# Patient Record
Sex: Male | Born: 2000 | Hispanic: Yes | Marital: Single | Smoking: Never smoker
Health system: Southern US, Community
[De-identification: ages and names within clinical notes are randomized; demographics above are authoritative.]

## PROBLEM LIST (undated history)

## (undated) DIAGNOSIS — J45909 Unspecified asthma, uncomplicated: Secondary | ICD-10-CM

---
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
## 2019-07-24 DIAGNOSIS — Z419 Encounter for procedure for purposes other than remedying health state, unspecified: Secondary | ICD-10-CM | POA: Diagnosis not present

## 2019-08-24 DIAGNOSIS — Z419 Encounter for procedure for purposes other than remedying health state, unspecified: Secondary | ICD-10-CM | POA: Diagnosis not present

## 2019-09-24 DIAGNOSIS — Z419 Encounter for procedure for purposes other than remedying health state, unspecified: Secondary | ICD-10-CM | POA: Diagnosis not present

## 2019-10-24 DIAGNOSIS — Z419 Encounter for procedure for purposes other than remedying health state, unspecified: Secondary | ICD-10-CM | POA: Diagnosis not present

## 2019-11-24 DIAGNOSIS — Z419 Encounter for procedure for purposes other than remedying health state, unspecified: Secondary | ICD-10-CM | POA: Diagnosis not present

## 2019-11-25 ENCOUNTER — Other Ambulatory Visit: Payer: Self-pay

## 2019-11-25 ENCOUNTER — Ambulatory Visit (INDEPENDENT_AMBULATORY_CARE_PROVIDER_SITE_OTHER): Payer: Self-pay

## 2019-11-25 ENCOUNTER — Encounter: Payer: Self-pay | Admitting: Emergency Medicine

## 2019-11-25 ENCOUNTER — Ambulatory Visit
Admission: EM | Admit: 2019-11-25 | Discharge: 2019-11-25 | Disposition: A | Discharge status: Home / Self Care | Payer: Self-pay | Attending: Emergency Medicine | Admitting: Emergency Medicine

## 2019-11-25 DIAGNOSIS — M25551 Pain in right hip: Secondary | ICD-10-CM

## 2019-11-25 LAB — CBC WITH DIFFERENTIAL/PLATELET
Abs Immature Granulocytes: 0.02 10*3/uL (ref 0.00–0.07)
Basophils Absolute: 0 10*3/uL (ref 0.0–0.1)
Basophils Relative: 1 %
Eosinophils Absolute: 0.1 10*3/uL (ref 0.0–0.5)
Eosinophils Relative: 2 %
HCT: 44.5 % (ref 39.0–52.0)
Hemoglobin: 15.1 g/dL (ref 13.0–17.0)
Immature Granulocytes: 0 %
Lymphocytes Relative: 27 %
Lymphs Abs: 1.9 10*3/uL (ref 0.7–4.0)
MCH: 29.2 pg (ref 26.0–34.0)
MCHC: 33.9 g/dL (ref 30.0–36.0)
MCV: 86.1 fL (ref 80.0–100.0)
Monocytes Absolute: 0.6 10*3/uL (ref 0.1–1.0)
Monocytes Relative: 9 %
Neutro Abs: 4.2 10*3/uL (ref 1.7–7.7)
Neutrophils Relative %: 61 %
Platelets: 267 10*3/uL (ref 150–400)
RBC: 5.17 MIL/uL (ref 4.22–5.81)
RDW: 12.6 % (ref 11.5–15.5)
WBC: 6.9 10*3/uL (ref 4.0–10.5)
nRBC: 0 % (ref 0.0–0.2)

## 2019-11-25 MED ORDER — IBUPROFEN 600 MG PO TABS: 600.0000 mg | ORAL_TABLET | Freq: Four times a day (QID) | ORAL | 0 refills | Status: DC | PRN

## 2019-11-25 NOTE — ED Triage Notes (Signed)
Patient c/o right hip pain that started 1 year ago. He states over the last week the pain has increased.

## 2019-11-25 NOTE — ED Provider Notes (Signed)
[facility]    CSN: [id number] Arrival date & time: 11/25/19  1218      History   Chief Complaint Chief Complaint  Patient presents with  . Hip Pain    HPI Timothy Garza is a 19 y.o. male.   19 year old male here for evaluation of right hip pain.  Patient reports that she has had pain in the hip for a year but has become worse in the past week.  He states that he has never been evaluated for this.  He denies injury.  He describes the pain as throbbing and radiating to the level of the knee.  Patient denies back pain, bruising, fever, numbness, sweats, chills, or weight loss.     History reviewed. No pertinent past medical history.  There are no problems to display for this patient.   History reviewed. No pertinent surgical history.     Home Medications    Prior to Admission medications   Medication Sig Start Date End Date Taking? Authorizing Provider  ibuprofen (ADVIL) 600 MG tablet Take 1 tablet (600 mg total) by mouth every 6 (six) hours as needed. 11/25/19   Becky Augusta, NP    Family History Family History  Problem Relation Age of Onset  . Healthy Mother   . Healthy Father     Social History Social History   Tobacco Use  . Smoking status: Never Smoker  . Smokeless tobacco: Never Used  Vaping Use  . Vaping Use: Never used  Substance Use Topics  . Alcohol use: Never  . Drug use: Never     Allergies   Patient has no known allergies.   Review of Systems Review of Systems  Constitutional: Negative for activity change, appetite change, diaphoresis, fatigue, fever and unexpected weight change.  Respiratory: Negative for shortness of breath.   Cardiovascular: Negative for chest pain.  Musculoskeletal: Positive for arthralgias and gait problem. Negative for back pain, joint swelling and myalgias.  Skin: Negative for color change and rash.  Neurological: Negative for weakness and numbness.  Hematological: Negative.     Psychiatric/Behavioral: Negative.      Physical Exam Triage Vital Signs ED Triage Vitals  Enc Vitals Group     BP 11/25/19 1231 (!) 138/58     Pulse Rate 11/25/19 1231 63     Resp 11/25/19 1231 18     Temp 11/25/19 1231 98 F (36.7 C)     Temp Source 11/25/19 1231 Oral     SpO2 11/25/19 1231 99 %     Weight --      Height 11/25/19 1229 5\' 9"  (1.753 m)     Head Circumference --      Peak Flow --      Pain Score 11/25/19 1229 6     Pain Loc --      Pain Edu? --      Excl. in GC? --    No data found.  Updated Vital Signs BP (!) 138/58 (BP Location: Right Arm)   Pulse 63   Temp 98 F (36.7 C) (Oral)   Resp 18   Ht 5\' 9"  (1.753 m)   SpO2 99%   Visual Acuity Right Eye Distance:   Left Eye Distance:   Bilateral Distance:    Right Eye Near:   Left Eye Near:    Bilateral Near:     Physical Exam Vitals and nursing note reviewed.  Constitutional:      General: He is not in acute distress.  Appearance: Normal appearance. He is normal weight. He is not toxic-appearing.  HENT:     Head: Normocephalic and atraumatic.  Eyes:     General: No scleral icterus.    Extraocular Movements: Extraocular movements intact.     Conjunctiva/sclera: Conjunctivae normal.     Pupils: Pupils are equal, round, and reactive to light.  Cardiovascular:     Rate and Rhythm: Normal rate and regular rhythm.     Pulses: Normal pulses.     Heart sounds: Normal heart sounds. No murmur heard.  No gallop.   Pulmonary:     Effort: Pulmonary effort is normal.     Breath sounds: Normal breath sounds. No wheezing, rhonchi or rales.  Musculoskeletal:        General: Tenderness present. No swelling, deformity or signs of injury.     Cervical back: Normal range of motion and neck supple.     Right lower leg: No edema.     Comments: Patient has bony tenderness to the iliac crest on palpation.  There is no pain to the greater trochanter.  Patient does have pain with passive range of motion.  From  45 through 90 degrees of flexion patient has pain in the right hip.  The pain is not present with extension, internal rotation or external rotation.  Negative straight leg raise on the right side.  DP and PT pulses are 2+ on the right.  Skin:    General: Skin is warm and dry.     Capillary Refill: Capillary refill takes less than 2 seconds.     Findings: No bruising, erythema, lesion or rash.  Neurological:     General: No focal deficit present.     Mental Status: He is alert and oriented to person, place, and time.  Psychiatric:        Mood and Affect: Mood normal.        Behavior: Behavior normal.        Thought Content: Thought content normal.        Judgment: Judgment normal.      UC Treatments / Results  Labs (all labs ordered are listed, but only abnormal results are displayed) Labs Reviewed  CBC WITH DIFFERENTIAL/PLATELET    EKG   Radiology DG Hip Unilat W or Wo Pelvis 2-3 Views Right  Result Date: 11/25/2019 CLINICAL DATA:  Tenderness to right iliac crest.  No known injury. EXAM: DG HIP (WITH OR WITHOUT PELVIS) 2-3V RIGHT COMPARISON:  No prior. FINDINGS: No acute bony or joint abnormality. No evidence of fracture dislocation. Tiny well sclerotic density noted over the left acetabulum and right femoral head, most likely bone islands. IMPRESSION: No acute or focal abnormality identified. Electronically Signed   By: Maisie Fus  Register   On: 11/25/2019 13:46    Procedures Procedures (including critical care time)  Medications Ordered in UC Medications - No data to display  Initial Impression / Assessment and Plan / UC Course  I have reviewed the triage vital signs and the nursing notes.  Pertinent labs & imaging results that were available during my care of the patient were reviewed by me and considered in my medical decision making (see chart for details).   Patient is here for evaluation of right hip pain that he has had for a year but has gotten worse over the past  week.  There is no trauma history and patient cannot think of any precipitating events.  Patient is tender on his iliac crest on the right.  He has tenderness to the hip joint with range of motion.  Patient denies any history of "B symptoms" will obtain CBC to look for inflammation or infection as well as radiograph of right hip and pelvis.  X-rays of right hip and pelvis are unremarkable.  Awaiting radiology overread.  Radiology read also negative for fracture or abnormality.  Markable. Will discharge patient home with musculoskeletal right hip pain, treat with NSAIDs, and have patient follow-up with his primary care provider if his symptoms continue.   Final Clinical Impressions(s) / UC Diagnoses   Final diagnoses:  Right hip pain     Discharge Instructions     Use the ibuprofen every 6 hours with food as needed for your pain.  Your hip is much as possible.  If your pain continues follow-up with your primary care doctor or orthopedics.    ED Prescriptions    Medication Sig Dispense Auth. Provider   ibuprofen (ADVIL) 600 MG tablet Take 1 tablet (600 mg total) by mouth every 6 (six) hours as needed. 30 tablet Becky Augusta, NP     PDMP not reviewed this encounter.   Becky Augusta, NP 11/25/19 1438

## 2019-11-25 NOTE — Discharge Instructions (Addendum)
Use the ibuprofen every 6 hours with food as needed for your pain.  Your hip is much as possible.  If your pain continues follow-up with your primary care doctor or orthopedics.

## 2019-12-10 DIAGNOSIS — S76011A Strain of muscle, fascia and tendon of right hip, initial encounter: Secondary | ICD-10-CM | POA: Diagnosis not present

## 2019-12-10 DIAGNOSIS — Z1159 Encounter for screening for other viral diseases: Secondary | ICD-10-CM | POA: Diagnosis not present

## 2019-12-10 DIAGNOSIS — Z1389 Encounter for screening for other disorder: Secondary | ICD-10-CM | POA: Diagnosis not present

## 2019-12-10 DIAGNOSIS — Z0000 Encounter for general adult medical examination without abnormal findings: Secondary | ICD-10-CM | POA: Diagnosis not present

## 2019-12-10 DIAGNOSIS — J4520 Mild intermittent asthma, uncomplicated: Secondary | ICD-10-CM | POA: Diagnosis not present

## 2019-12-10 DIAGNOSIS — Z23 Encounter for immunization: Secondary | ICD-10-CM | POA: Diagnosis not present

## 2019-12-24 DIAGNOSIS — Z419 Encounter for procedure for purposes other than remedying health state, unspecified: Secondary | ICD-10-CM | POA: Diagnosis not present

## 2020-01-24 DIAGNOSIS — Z419 Encounter for procedure for purposes other than remedying health state, unspecified: Secondary | ICD-10-CM | POA: Diagnosis not present

## 2020-02-24 DIAGNOSIS — Z419 Encounter for procedure for purposes other than remedying health state, unspecified: Secondary | ICD-10-CM | POA: Diagnosis not present

## 2020-03-23 DIAGNOSIS — Z419 Encounter for procedure for purposes other than remedying health state, unspecified: Secondary | ICD-10-CM | POA: Diagnosis not present

## 2020-04-23 DIAGNOSIS — Z419 Encounter for procedure for purposes other than remedying health state, unspecified: Secondary | ICD-10-CM | POA: Diagnosis not present

## 2020-05-23 DIAGNOSIS — Z419 Encounter for procedure for purposes other than remedying health state, unspecified: Secondary | ICD-10-CM | POA: Diagnosis not present

## 2020-06-03 DIAGNOSIS — Z20822 Contact with and (suspected) exposure to covid-19: Secondary | ICD-10-CM | POA: Diagnosis not present

## 2020-06-03 DIAGNOSIS — J4520 Mild intermittent asthma, uncomplicated: Secondary | ICD-10-CM | POA: Diagnosis not present

## 2020-06-16 ENCOUNTER — Ambulatory Visit
Admission: EM | Admit: 2020-06-16 | Discharge: 2020-06-16 | Disposition: A | Discharge status: Home / Self Care | Payer: Medicaid Other | Attending: Sports Medicine | Admitting: Sports Medicine

## 2020-06-16 ENCOUNTER — Other Ambulatory Visit: Payer: Self-pay

## 2020-06-16 ENCOUNTER — Encounter: Payer: Self-pay | Admitting: Emergency Medicine

## 2020-06-16 DIAGNOSIS — R1013 Epigastric pain: Secondary | ICD-10-CM | POA: Insufficient documentation

## 2020-06-16 DIAGNOSIS — B349 Viral infection, unspecified: Secondary | ICD-10-CM

## 2020-06-16 DIAGNOSIS — Z20822 Contact with and (suspected) exposure to covid-19: Secondary | ICD-10-CM | POA: Insufficient documentation

## 2020-06-16 DIAGNOSIS — R0602 Shortness of breath: Secondary | ICD-10-CM | POA: Diagnosis not present

## 2020-06-16 DIAGNOSIS — R519 Headache, unspecified: Secondary | ICD-10-CM | POA: Insufficient documentation

## 2020-06-16 DIAGNOSIS — R071 Chest pain on breathing: Secondary | ICD-10-CM | POA: Insufficient documentation

## 2020-06-16 DIAGNOSIS — R110 Nausea: Secondary | ICD-10-CM | POA: Insufficient documentation

## 2020-06-16 HISTORY — DX: Unspecified asthma, uncomplicated: J45.909

## 2020-06-16 NOTE — Discharge Instructions (Addendum)
Isolate at home pending the results of your COVID test.  If you test positive then you will have to quarantine for 5 days from the start of your symptoms.  After 5 days you can break quarantine if your symptoms have improved and you have not had a fever for 24 hours without taking Tylenol or ibuprofen.  Use over-the-counter Tylenol and ibuprofen as needed for body aches and fever.  If you develop any increased shortness of breath-especially at rest, you are unable to speak in full sentences, or is a late sign your lips are turning blue you need to go the ER for evaluation.  

## 2020-06-16 NOTE — ED Provider Notes (Signed)
[facility]    CSN: [id number] Arrival date & time: 06/16/20  1927      History   Chief Complaint Chief Complaint  Patient presents with  . Headache    HPI Timothy Garza is a 20 y.o. male.   HPI   20 year old male here for evaluation of headache, fatigue, and epigastric pain.  Patient reports his symptoms started this morning when he woke up.  He states that he gets some pain in the middle of his chest when he takes a deep breath, has had some body aches, some intermittent shortness of breath, and nausea.  He denies fever, runny nose or nasal congestion, sore throat, ear pain or pressure, cough, wheezing, vomiting, or diarrhea.  Past Medical History:  Diagnosis Date  . Asthma     There are no problems to display for this patient.   History reviewed. No pertinent surgical history.     Home Medications    Prior to Admission medications   Medication Sig Start Date End Date Taking? Authorizing Provider  PROAIR HFA 108 [phone] Base) MCG/ACT inhaler Inhale 2 puffs into the lungs every 4 (four) hours as needed. 06/03/20   [provider]    Family History Family History  Problem Relation Age of Onset  . Healthy Mother   . Healthy Father     Social History Social History   Tobacco Use  . Smoking status: Never Smoker  . Smokeless tobacco: Never Used  Vaping Use  . Vaping Use: Never used  Substance Use Topics  . Alcohol use: Never  . Drug use: Never     Allergies   Patient has no known allergies.   Review of Systems Review of Systems  Constitutional: Positive for fatigue. Negative for activity change, appetite change and fever.  HENT: Negative for congestion, ear discharge, ear pain, rhinorrhea and sore throat.   Respiratory: Positive for shortness of breath. Negative for cough and wheezing.   Gastrointestinal: Positive for nausea. Negative for diarrhea and vomiting.  Musculoskeletal: Positive for arthralgias and myalgias.   Neurological: Positive for headaches.  Hematological: Negative.   Psychiatric/Behavioral: Negative.      Physical Exam Triage Vital Signs ED Triage Vitals [06/16/20 1950]  Enc Vitals Group     BP 128/73     Pulse Rate 94     Resp 18     Temp 99.5 F (37.5 C)     Temp Source Oral     SpO2 100 %     Weight 220 lb (99.8 kg)     Height 5\' 9"  (1.753 m)     Head Circumference      Peak Flow      Pain Score 7     Pain Loc      Pain Edu?      Excl. in GC?    No data found.  Updated Vital Signs BP 128/73 (BP Location: Left Arm)   Pulse 94   Temp 99.5 F (37.5 C) (Oral)   Resp 18   Ht 5\' 9"  (1.753 m)   Wt 220 lb (99.8 kg)   SpO2 100%   BMI 32.49 kg/m   Visual Acuity Right Eye Distance:   Left Eye Distance:   Bilateral Distance:    Right Eye Near:   Left Eye Near:    Bilateral Near:     Physical Exam Vitals and nursing note reviewed.  Constitutional:      General: He is not in acute distress.  Appearance: Normal appearance. He is well-developed and normal weight. He is not ill-appearing.  HENT:     Head: Normocephalic and atraumatic.     Right Ear: Tympanic membrane, ear canal and external ear normal.     Left Ear: Tympanic membrane, ear canal and external ear normal.     Nose: Congestion present. No rhinorrhea.     Mouth/Throat:     Mouth: Mucous membranes are moist.     Pharynx: Oropharynx is clear. No posterior oropharyngeal erythema.  Cardiovascular:     Rate and Rhythm: Normal rate and regular rhythm.     Pulses: Normal pulses.     Heart sounds: Normal heart sounds. No murmur heard. No gallop.   Pulmonary:     Effort: Pulmonary effort is normal.     Breath sounds: Normal breath sounds. No wheezing, rhonchi or rales.  Abdominal:     General: Abdomen is flat. Bowel sounds are normal.     Palpations: Abdomen is soft.     Tenderness: There is abdominal tenderness. There is no guarding or rebound.  Musculoskeletal:     Cervical back: Normal range of  motion and neck supple.  Lymphadenopathy:     Cervical: No cervical adenopathy.  Skin:    General: Skin is warm and dry.     Capillary Refill: Capillary refill takes less than 2 seconds.     Findings: No erythema or rash.  Neurological:     General: No focal deficit present.     Mental Status: He is alert and oriented to person, place, and time.  Psychiatric:        Mood and Affect: Mood normal.        Behavior: Behavior normal.        Thought Content: Thought content normal.        Judgment: Judgment normal.      UC Treatments / Results  Labs (all labs ordered are listed, but only abnormal results are displayed) Labs Reviewed  SARS CORONAVIRUS 2 (TAT 6-24 HRS)    EKG   Radiology No results found.  Procedures Procedures (including critical care time)  Medications Ordered in UC Medications - No data to display  Initial Impression / Assessment and Plan / UC Course  I have reviewed the triage vital signs and the nursing notes.  Pertinent labs & imaging results that were available during my care of the patient were reviewed by me and considered in my medical decision making (see chart for details).   Patient is a nontoxic-appearing 20 year old male here for evaluation of headache, fatigue, body aches, shortness of breath, nausea, and epigastric pain as well as occasional pain when he takes a deep breath that all started this morning.  He denies fever, runny nose or nasal congestion, sore throat, ear pain, cough, wheezing, vomiting, or diarrhea.  Patient's physical exam reveals pearly gray tympanic membranes bilaterally with a normal light reflex and clear external auditory canals.  Nasal mucosa is erythematous and edematous without overt nasal discharge.  Oropharyngeal exam is benign.  No cervical lymphadenopathy appreciated exam.  Cardiopulmonary exam is benign.  Abdomen is soft, nondistended, with positive bowel sounds in all 4 quadrants.  Patient has very mild epigastric  tenderness without guarding or rebound.  Will swab patient for COVID and discharged home to isolate pending the results.  Return ER precautions reviewed with patient.  Work note provided.   Final Clinical Impressions(s) / UC Diagnoses   Final diagnoses:  Viral illness  Discharge Instructions     Isolate at home pending the results of your COVID test.  If you test positive then you will have to quarantine for 5 days from the start of your symptoms.  After 5 days you can break quarantine if your symptoms have improved and you have not had a fever for 24 hours without taking Tylenol or ibuprofen.  Use over-the-counter Tylenol and ibuprofen as needed for body aches and fever.  If you develop any increased shortness of breath-especially at rest, you are unable to speak in full sentences, or is a late sign your lips are turning blue you need to go the ER for evaluation.     ED Prescriptions    None     PDMP not reviewed this encounter.   Becky Augusta, NP 06/16/20 2017

## 2020-06-16 NOTE — ED Triage Notes (Signed)
Pt c/o headache, weakness, and pain in the center of his chest when he takes a deep breath. Started this morning. Denies fever.

## 2020-06-17 LAB — SARS CORONAVIRUS 2 (TAT 6-24 HRS): SARS Coronavirus 2: NEGATIVE

## 2020-06-23 DIAGNOSIS — Z419 Encounter for procedure for purposes other than remedying health state, unspecified: Secondary | ICD-10-CM | POA: Diagnosis not present

## 2020-07-23 DIAGNOSIS — Z419 Encounter for procedure for purposes other than remedying health state, unspecified: Secondary | ICD-10-CM | POA: Diagnosis not present

## 2020-08-23 DIAGNOSIS — Z419 Encounter for procedure for purposes other than remedying health state, unspecified: Secondary | ICD-10-CM | POA: Diagnosis not present

## 2020-09-23 DIAGNOSIS — Z419 Encounter for procedure for purposes other than remedying health state, unspecified: Secondary | ICD-10-CM | POA: Diagnosis not present

## 2020-10-23 DIAGNOSIS — Z419 Encounter for procedure for purposes other than remedying health state, unspecified: Secondary | ICD-10-CM | POA: Diagnosis not present

## 2020-11-23 DIAGNOSIS — Z419 Encounter for procedure for purposes other than remedying health state, unspecified: Secondary | ICD-10-CM | POA: Diagnosis not present

## 2020-12-23 DIAGNOSIS — Z419 Encounter for procedure for purposes other than remedying health state, unspecified: Secondary | ICD-10-CM | POA: Diagnosis not present

## 2021-01-23 DIAGNOSIS — Z419 Encounter for procedure for purposes other than remedying health state, unspecified: Secondary | ICD-10-CM | POA: Diagnosis not present

## 2021-02-23 DIAGNOSIS — Z419 Encounter for procedure for purposes other than remedying health state, unspecified: Secondary | ICD-10-CM | POA: Diagnosis not present

## 2021-03-23 DIAGNOSIS — Z419 Encounter for procedure for purposes other than remedying health state, unspecified: Secondary | ICD-10-CM | POA: Diagnosis not present

## 2021-04-14 ENCOUNTER — Other Ambulatory Visit: Payer: Self-pay

## 2021-04-14 ENCOUNTER — Encounter: Payer: Self-pay | Admitting: Emergency Medicine

## 2021-04-14 ENCOUNTER — Ambulatory Visit
Admission: EM | Admit: 2021-04-14 | Discharge: 2021-04-14 | Disposition: A | Discharge status: Home / Self Care | Payer: Medicaid Other | Attending: Physician Assistant | Admitting: Physician Assistant

## 2021-04-14 DIAGNOSIS — B349 Viral infection, unspecified: Secondary | ICD-10-CM | POA: Diagnosis not present

## 2021-04-14 DIAGNOSIS — R519 Headache, unspecified: Secondary | ICD-10-CM | POA: Insufficient documentation

## 2021-04-14 DIAGNOSIS — Z20822 Contact with and (suspected) exposure to covid-19: Secondary | ICD-10-CM | POA: Insufficient documentation

## 2021-04-14 DIAGNOSIS — R051 Acute cough: Secondary | ICD-10-CM | POA: Diagnosis not present

## 2021-04-14 DIAGNOSIS — J45909 Unspecified asthma, uncomplicated: Secondary | ICD-10-CM | POA: Diagnosis not present

## 2021-04-14 MED ORDER — PROMETHAZINE-DM 6.25-15 MG/5ML PO SYRP: 5.0000 mL | ORAL_SOLUTION | Freq: Four times a day (QID) | ORAL | 0 refills | Status: DC | PRN

## 2021-04-14 NOTE — ED Triage Notes (Signed)
Pt c/o cough, chest tightness, nasal congestion. Started about 2 days ago. Denies fever.  ?

## 2021-04-14 NOTE — Discharge Instructions (Signed)
URI/COLD SYMPTOMS: Your exam today is consistent with a viral illness. Antibiotics are not indicated at this time. Use medications as directed, including cough syrup, nasal saline, and decongestants. Your symptoms should improve over the next few days and resolve within 7-10 days. Increase rest and fluids. F/u if symptoms worsen or predominate such as sore throat, ear pain, productive cough, shortness of breath, or if you develop high fevers or worsening fatigue over the next several days.   ? ?We have tested you for COVID.  We will call with results within 24 hours if you are positive.  If you are positive you to isolate 5 days and wear mask provided.  If your test is negative, you likely have what ever viruses going around your work or may be what your dad had.  Most of you feel better within a week.  I have sent cough medicine for you.  Increase rest and fluids.  Use your inhaler if you need to.  If you feel like the chest tightness or breathing worsening, you should be seen again. ?

## 2021-04-14 NOTE — ED Provider Notes (Signed)
?[facility] ? ? ? ?CSN: [id number] ?Arrival date & time: 04/14/21  7106 ? ? ?  ? ?History   ?Chief Complaint ?Chief Complaint  ?Patient presents with  ? Cough  ? ? ?HPI ?Timothy Garza is a 21 y.o. male with history of mild asthma.  Patient presents today for 2-day history of cough, chest tightness, body aches, nasal congestion, sore throat.  Denies fever, fatigue, shortness of breath, abdominal pain, nausea/vomiting or diarrhea.  Patient has been around multiple sick coworkers who have had fevers and sneezing.  Patient also says his father has been recently sick.  He denies any known exposure to COVID-19.  He has taken over-the-counter cold and sinus medication and used his albuterol inhaler.  No other complaints. ? ?HPI ? ?Past Medical History:  ?Diagnosis Date  ? Asthma   ? ? ?There are no problems to display for this patient. ? ? ?History reviewed. No pertinent surgical history. ? ? ? ? ?Home Medications   ? ?Prior to Admission medications   ?Medication Sig Start Date End Date Taking? Authorizing Provider  ?PROAIR HFA 108 (90 Base) MCG/ACT inhaler Inhale 2 puffs into the lungs every 4 (four) hours as needed. 06/03/20  Yes [provider]  ?promethazine-dextromethorphan (PROMETHAZINE-DM) 6.25-15 MG/5ML syrup Take 5 mLs by mouth 4 (four) times daily as needed. 04/14/21  Yes Shirlee Latch, PA-C  ? ? ?Family History ?Family History  ?Problem Relation Age of Onset  ? Healthy Mother   ? Healthy Father   ? ? ?Social History ?Social History  ? ?Tobacco Use  ? Smoking status: Never  ? Smokeless tobacco: Never  ?Vaping Use  ? Vaping Use: Never used  ?Substance Use Topics  ? Alcohol use: Never  ? Drug use: Never  ? ? ? ?Allergies   ?Patient has no known allergies. ? ? ?Review of Systems ?Review of Systems  ?Constitutional:  Negative for fatigue and fever.  ?HENT:  Positive for congestion, rhinorrhea and sore throat. Negative for sinus pressure and sinus pain.   ?Respiratory:  Positive for cough and  chest tightness. Negative for shortness of breath and wheezing.   ?Cardiovascular:  Negative for chest pain.  ?Gastrointestinal:  Negative for abdominal pain, diarrhea, nausea and vomiting.  ?Musculoskeletal:  Positive for myalgias.  ?Neurological:  Negative for weakness, light-headedness and headaches.  ?Hematological:  Negative for adenopathy.  ? ? ?Physical Exam ?Triage Vital Signs ?ED Triage Vitals  ?Enc Vitals Group  ?   BP 04/14/21 1032 119/61  ?   Pulse Rate 04/14/21 1032 61  ?   Resp 04/14/21 1032 18  ?   Temp 04/14/21 1032 97.8 ?F (36.6 ?C)  ?   Temp Source 04/14/21 1032 Oral  ?   SpO2 04/14/21 1032 98 %  ?   Weight 04/14/21 1030 220 lb 0.3 oz (99.8 kg)  ?   Height 04/14/21 1030 5\' 9"  (1.753 m)  ?   Head Circumference --   ?   Peak Flow --   ?   Pain Score 04/14/21 1029 6  ?   Pain Loc --   ?   Pain Edu? --   ?   Excl. in GC? --   ? ?No data found. ? ?Updated Vital Signs ?BP 119/61 (BP Location: Right Arm)   Pulse 61   Temp 97.8 ?F (36.6 ?C) (Oral)   Resp 18   Ht 5\' 9"  (1.753 m)   Wt 220 lb 0.3 oz (99.8 kg)   SpO2  98%   BMI 32.49 kg/m?  ? ?   ? ?Physical Exam ?Vitals and nursing note reviewed.  ?Constitutional:   ?   General: He is not in acute distress. ?   Appearance: Normal appearance. He is well-developed. He is not ill-appearing.  ?HENT:  ?   Head: Normocephalic and atraumatic.  ?   Nose: Congestion present.  ?   Mouth/Throat:  ?   Mouth: Mucous membranes are moist.  ?   Pharynx: Oropharynx is clear. Posterior oropharyngeal erythema (mild) present.  ?Eyes:  ?   General: No scleral icterus. ?   Conjunctiva/sclera: Conjunctivae normal.  ?Cardiovascular:  ?   Rate and Rhythm: Normal rate and regular rhythm.  ?   Heart sounds: Normal heart sounds.  ?Pulmonary:  ?   Effort: Pulmonary effort is normal. No respiratory distress.  ?   Breath sounds: Normal breath sounds. No wheezing, rhonchi or rales.  ?Musculoskeletal:  ?   Cervical back: Neck supple.  ?Skin: ?   General: Skin is warm and dry.  ?    Capillary Refill: Capillary refill takes less than 2 seconds.  ?Neurological:  ?   General: No focal deficit present.  ?   Mental Status: He is alert. Mental status is at baseline.  ?   Motor: No weakness.  ?   Coordination: Coordination normal.  ?   Gait: Gait normal.  ?Psychiatric:     ?   Mood and Affect: Mood normal.     ?   Behavior: Behavior normal.     ?   Thought Content: Thought content normal.  ? ? ? ?UC Treatments / Results  ?Labs ?(all labs ordered are listed, but only abnormal results are displayed) ?Labs Reviewed  ?SARS CORONAVIRUS 2 (TAT 6-24 HRS)  ? ? ?EKG ? ? ?Radiology ?No results found. ? ?Procedures ?Procedures (including critical care time) ? ?Medications Ordered in UC ?Medications - No data to display ? ?Initial Impression / Assessment and Plan / UC Course  ?I have reviewed the triage vital signs and the nursing notes. ? ?Pertinent labs & imaging results that were available during my care of the patient were reviewed by me and considered in my medical decision making (see chart for details). ? ?21 year old male presenting for 2-day history of cough, congestion, body aches, sore throat and chest tightness.  Vitals all normal and stable patient is overall well-appearing.  On exam he has nasal congestion and mild posterior pharyngeal erythema.  Chest is clear to auscultation heart regular rate and rhythm.  PCR COVID test obtained.  Current CDC guidelines, isolation protocol and ED precautions reviewed with patient.  Advised and symptoms are consistent with a viral illness.  Supportive care encouraged increase rest and fluids.  Sent promethazine DM to pharmacy.  Advise continue inhaler as needed but if chest tightness worsens or he has shortness of breath needs to be seen again.  To ER for any severe acute changes.  Work note provided. ? ? ?Final Clinical Impressions(s) / UC Diagnoses  ? ?Final diagnoses:  ?Viral illness  ?Acute cough  ?Acute nonintractable headache, unspecified headache type   ? ? ? ?Discharge Instructions   ? ?  ?URI/COLD SYMPTOMS: Your exam today is consistent with a viral illness. Antibiotics are not indicated at this time. Use medications as directed, including cough syrup, nasal saline, and decongestants. Your symptoms should improve over the next few days and resolve within 7-10 days. Increase rest and fluids. F/u if symptoms worsen or predominate such  as sore throat, ear pain, productive cough, shortness of breath, or if you develop high fevers or worsening fatigue over the next several days.   ? ?We have tested you for COVID.  We will call with results within 24 hours if you are positive.  If you are positive you to isolate 5 days and wear mask provided.  If your test is negative, you likely have what ever viruses going around your work or may be what your dad had.  Most of you feel better within a week.  I have sent cough medicine for you.  Increase rest and fluids.  Use your inhaler if you need to.  If you feel like the chest tightness or breathing worsening, you should be seen again. ? ? ? ? ?ED Prescriptions   ? ? Medication Sig Dispense Auth. Provider  ? promethazine-dextromethorphan (PROMETHAZINE-DM) 6.25-15 MG/5ML syrup Take 5 mLs by mouth 4 (four) times daily as needed. 118 mL Eusebio FriendlyEaves, Jorje Vanatta B, PA-C  ? ?  ? ?PDMP not reviewed this encounter. ?  ?Shirlee Latchaves, Lometa Riggin B, PA-C ?04/14/21 1101 ? ?

## 2021-04-15 LAB — SARS CORONAVIRUS 2 (TAT 6-24 HRS): SARS Coronavirus 2: NEGATIVE

## 2021-04-23 DIAGNOSIS — Z419 Encounter for procedure for purposes other than remedying health state, unspecified: Secondary | ICD-10-CM | POA: Diagnosis not present

## 2021-05-23 DIAGNOSIS — Z419 Encounter for procedure for purposes other than remedying health state, unspecified: Secondary | ICD-10-CM | POA: Diagnosis not present

## 2021-06-23 DIAGNOSIS — Z419 Encounter for procedure for purposes other than remedying health state, unspecified: Secondary | ICD-10-CM | POA: Diagnosis not present

## 2021-07-23 DIAGNOSIS — Z419 Encounter for procedure for purposes other than remedying health state, unspecified: Secondary | ICD-10-CM | POA: Diagnosis not present

## 2021-08-23 DIAGNOSIS — Z419 Encounter for procedure for purposes other than remedying health state, unspecified: Secondary | ICD-10-CM | POA: Diagnosis not present

## 2021-09-23 DIAGNOSIS — Z419 Encounter for procedure for purposes other than remedying health state, unspecified: Secondary | ICD-10-CM | POA: Diagnosis not present

## 2021-10-23 DIAGNOSIS — Z419 Encounter for procedure for purposes other than remedying health state, unspecified: Secondary | ICD-10-CM | POA: Diagnosis not present

## 2021-11-23 DIAGNOSIS — Z419 Encounter for procedure for purposes other than remedying health state, unspecified: Secondary | ICD-10-CM | POA: Diagnosis not present

## 2021-12-23 DIAGNOSIS — Z419 Encounter for procedure for purposes other than remedying health state, unspecified: Secondary | ICD-10-CM | POA: Diagnosis not present

## 2022-01-03 DIAGNOSIS — R079 Chest pain, unspecified: Secondary | ICD-10-CM | POA: Diagnosis not present

## 2022-01-03 DIAGNOSIS — R0789 Other chest pain: Secondary | ICD-10-CM | POA: Diagnosis not present

## 2022-01-04 DIAGNOSIS — R079 Chest pain, unspecified: Secondary | ICD-10-CM | POA: Diagnosis not present

## 2022-01-04 DIAGNOSIS — R0789 Other chest pain: Secondary | ICD-10-CM | POA: Diagnosis not present

## 2022-01-04 DIAGNOSIS — J069 Acute upper respiratory infection, unspecified: Secondary | ICD-10-CM | POA: Diagnosis not present

## 2022-01-04 DIAGNOSIS — Z20822 Contact with and (suspected) exposure to covid-19: Secondary | ICD-10-CM | POA: Diagnosis not present

## 2022-01-04 DIAGNOSIS — R001 Bradycardia, unspecified: Secondary | ICD-10-CM | POA: Diagnosis not present

## 2022-01-04 DIAGNOSIS — B974 Respiratory syncytial virus as the cause of diseases classified elsewhere: Secondary | ICD-10-CM | POA: Diagnosis not present

## 2022-01-04 DIAGNOSIS — J45909 Unspecified asthma, uncomplicated: Secondary | ICD-10-CM | POA: Diagnosis not present

## 2022-01-05 DIAGNOSIS — R079 Chest pain, unspecified: Secondary | ICD-10-CM | POA: Diagnosis not present

## 2022-01-10 DIAGNOSIS — Z1389 Encounter for screening for other disorder: Secondary | ICD-10-CM | POA: Diagnosis not present

## 2022-01-10 DIAGNOSIS — Z1331 Encounter for screening for depression: Secondary | ICD-10-CM | POA: Diagnosis not present

## 2022-01-10 DIAGNOSIS — M7090 Unspecified soft tissue disorder related to use, overuse and pressure of unspecified site: Secondary | ICD-10-CM | POA: Diagnosis not present

## 2022-01-23 DIAGNOSIS — Z419 Encounter for procedure for purposes other than remedying health state, unspecified: Secondary | ICD-10-CM | POA: Diagnosis not present

## 2022-04-24 DIAGNOSIS — Z419 Encounter for procedure for purposes other than remedying health state, unspecified: Secondary | ICD-10-CM | POA: Diagnosis not present

## 2022-05-15 IMAGING — CR DG HIP (WITH OR WITHOUT PELVIS) 2-3V*R*
3 series · 3 of 3 positions shown · non-contrast
Comparison: No prior.

CLINICAL DATA: Tenderness to right iliac crest.  No known injury.

EXAM:
DG HIP (WITH OR WITHOUT PELVIS) 2-3V RIGHT

[pelvis ap (1 of 2)]
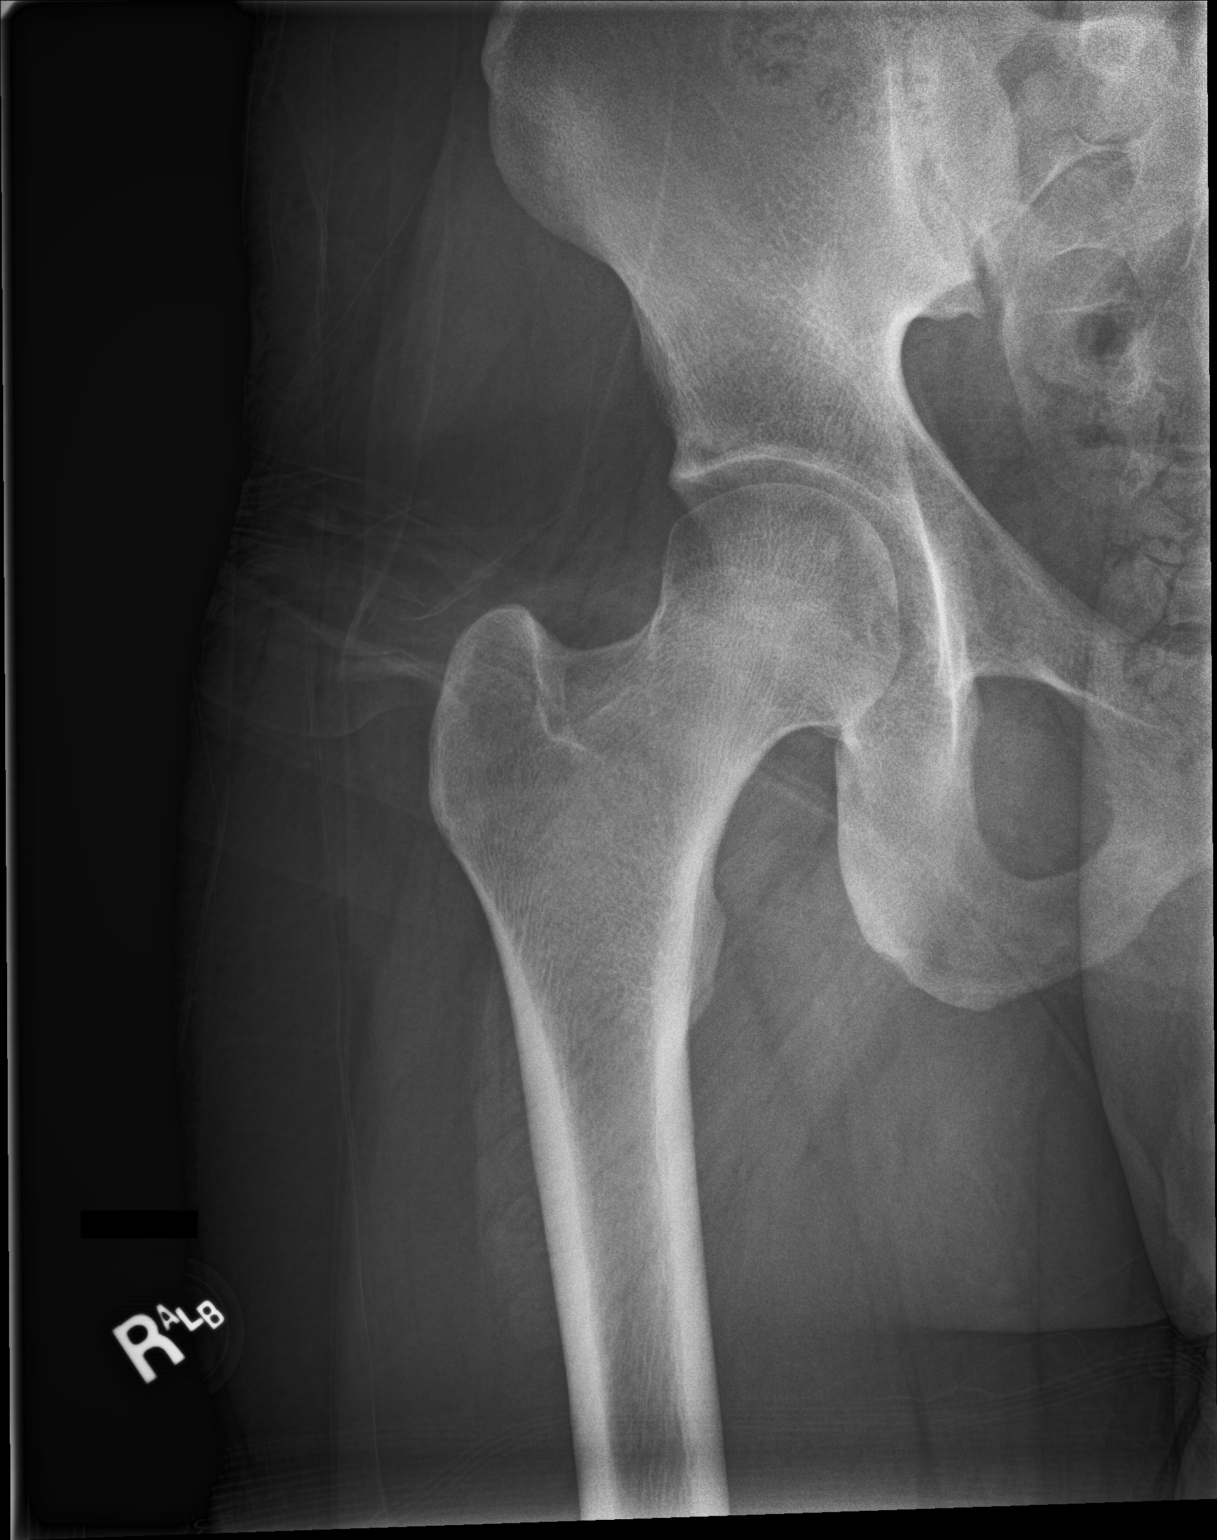

[pelvis ap (2 of 2)]
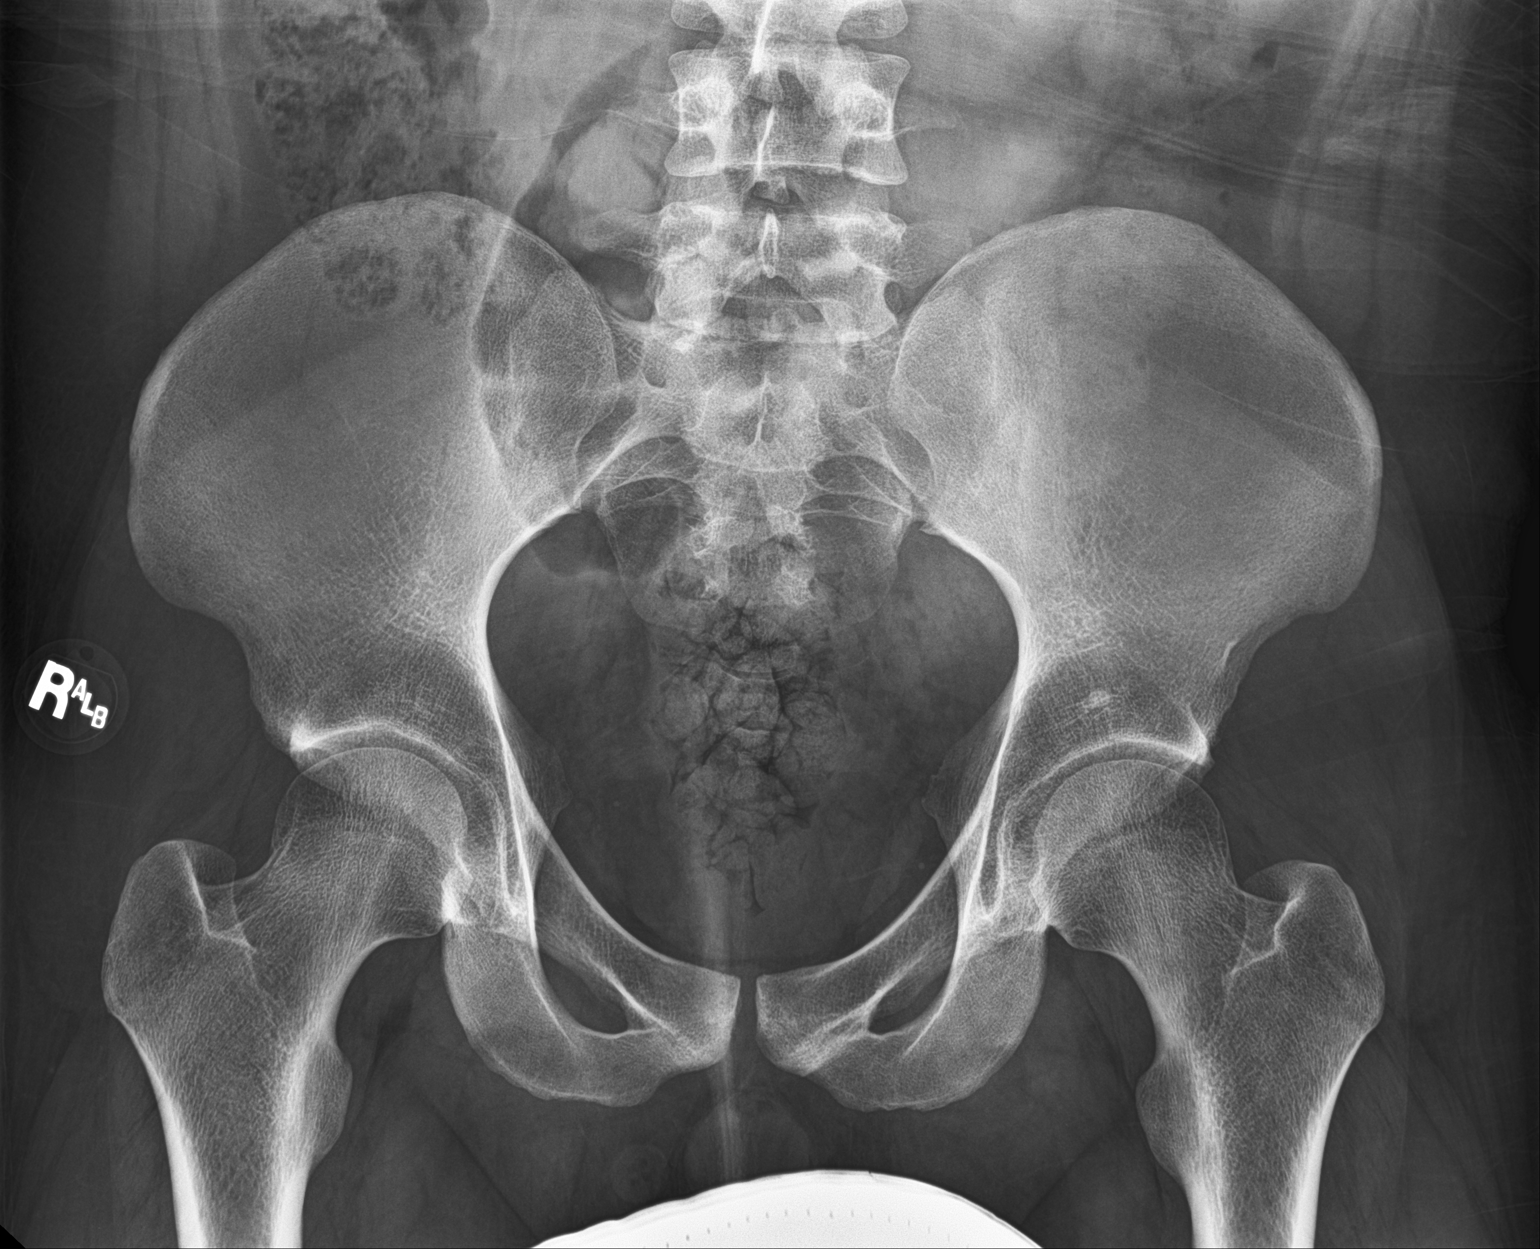

[hip lat]
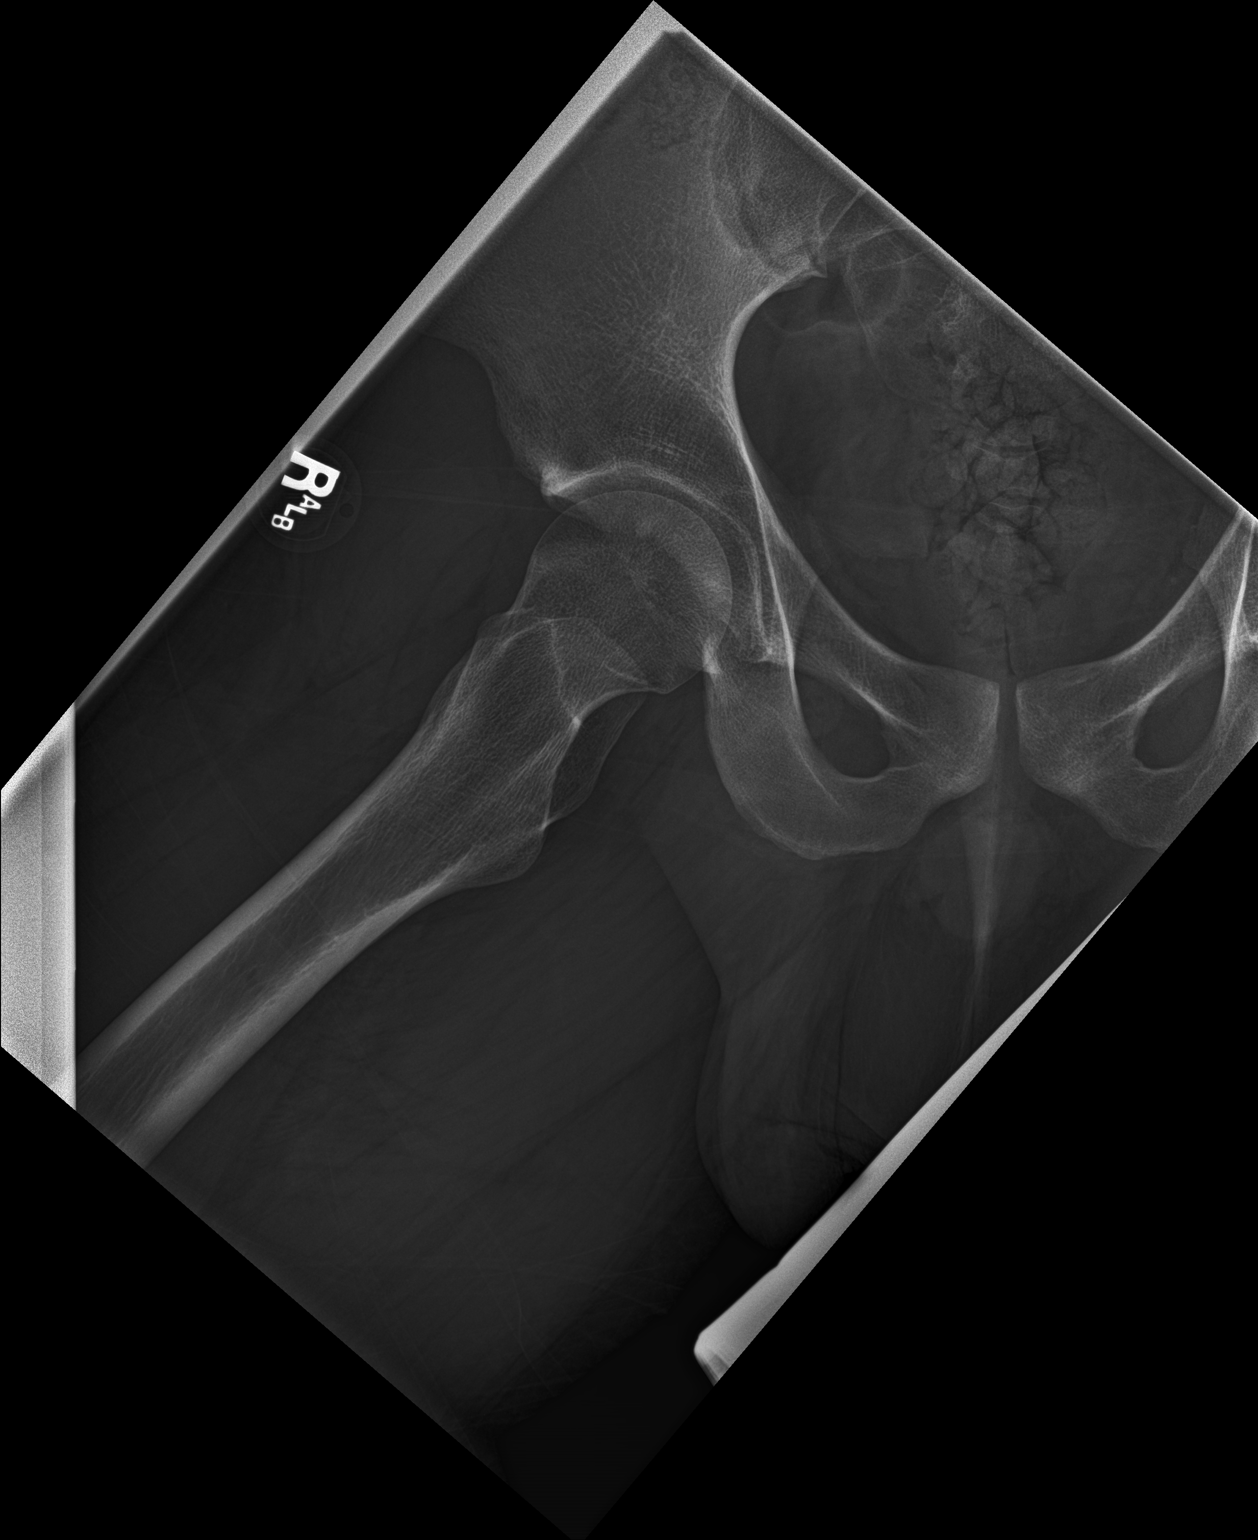

[3 of 3 positions shown; findings below may reference images not displayed]

FINDINGS: No acute bony or joint abnormality. No evidence of fracture
dislocation. Tiny well sclerotic density noted over the left
acetabulum and right femoral head, most likely bone islands.
IMPRESSION: No acute or focal abnormality identified.

## 2022-05-24 DIAGNOSIS — Z419 Encounter for procedure for purposes other than remedying health state, unspecified: Secondary | ICD-10-CM | POA: Diagnosis not present

## 2022-06-24 DIAGNOSIS — Z419 Encounter for procedure for purposes other than remedying health state, unspecified: Secondary | ICD-10-CM | POA: Diagnosis not present

## 2022-07-24 DIAGNOSIS — Z419 Encounter for procedure for purposes other than remedying health state, unspecified: Secondary | ICD-10-CM | POA: Diagnosis not present

## 2022-08-24 DIAGNOSIS — Z419 Encounter for procedure for purposes other than remedying health state, unspecified: Secondary | ICD-10-CM | POA: Diagnosis not present

## 2022-09-24 DIAGNOSIS — Z419 Encounter for procedure for purposes other than remedying health state, unspecified: Secondary | ICD-10-CM | POA: Diagnosis not present

## 2022-10-24 DIAGNOSIS — Z419 Encounter for procedure for purposes other than remedying health state, unspecified: Secondary | ICD-10-CM | POA: Diagnosis not present

## 2022-11-24 DIAGNOSIS — Z419 Encounter for procedure for purposes other than remedying health state, unspecified: Secondary | ICD-10-CM | POA: Diagnosis not present

## 2022-12-24 DIAGNOSIS — Z419 Encounter for procedure for purposes other than remedying health state, unspecified: Secondary | ICD-10-CM | POA: Diagnosis not present

## 2023-01-24 DIAGNOSIS — Z419 Encounter for procedure for purposes other than remedying health state, unspecified: Secondary | ICD-10-CM | POA: Diagnosis not present

## 2023-02-24 DIAGNOSIS — Z419 Encounter for procedure for purposes other than remedying health state, unspecified: Secondary | ICD-10-CM | POA: Diagnosis not present

## 2023-03-24 DIAGNOSIS — Z419 Encounter for procedure for purposes other than remedying health state, unspecified: Secondary | ICD-10-CM | POA: Diagnosis not present

## 2023-05-05 DIAGNOSIS — Z419 Encounter for procedure for purposes other than remedying health state, unspecified: Secondary | ICD-10-CM | POA: Diagnosis not present

## 2023-05-16 DIAGNOSIS — Z131 Encounter for screening for diabetes mellitus: Secondary | ICD-10-CM | POA: Diagnosis not present

## 2023-05-16 DIAGNOSIS — Z130 Encounter for screening for diseases of the blood and blood-forming organs and certain disorders involving the immune mechanism: Secondary | ICD-10-CM | POA: Diagnosis not present

## 2023-05-16 DIAGNOSIS — E66811 Obesity, class 1: Secondary | ICD-10-CM | POA: Diagnosis not present

## 2023-05-16 DIAGNOSIS — J4520 Mild intermittent asthma, uncomplicated: Secondary | ICD-10-CM | POA: Diagnosis not present

## 2023-05-16 DIAGNOSIS — Z13220 Encounter for screening for lipoid disorders: Secondary | ICD-10-CM | POA: Diagnosis not present

## 2023-05-16 DIAGNOSIS — B353 Tinea pedis: Secondary | ICD-10-CM | POA: Diagnosis not present

## 2023-05-16 DIAGNOSIS — Z1329 Encounter for screening for other suspected endocrine disorder: Secondary | ICD-10-CM | POA: Diagnosis not present

## 2023-05-16 DIAGNOSIS — Z113 Encounter for screening for infections with a predominantly sexual mode of transmission: Secondary | ICD-10-CM | POA: Diagnosis not present

## 2023-06-04 DIAGNOSIS — Z419 Encounter for procedure for purposes other than remedying health state, unspecified: Secondary | ICD-10-CM | POA: Diagnosis not present

## 2023-07-05 DIAGNOSIS — Z419 Encounter for procedure for purposes other than remedying health state, unspecified: Secondary | ICD-10-CM | POA: Diagnosis not present

## 2023-07-23 ENCOUNTER — Ambulatory Visit
Admission: EM | Admit: 2023-07-23 | Discharge: 2023-07-23 | Disposition: A | Discharge status: Home / Self Care | Attending: Physician Assistant | Admitting: Physician Assistant

## 2023-07-23 DIAGNOSIS — R519 Headache, unspecified: Secondary | ICD-10-CM | POA: Insufficient documentation

## 2023-07-23 DIAGNOSIS — R5383 Other fatigue: Secondary | ICD-10-CM | POA: Insufficient documentation

## 2023-07-23 LAB — SARS CORONAVIRUS 2 BY RT PCR: SARS Coronavirus 2 by RT PCR: NEGATIVE

## 2023-07-23 MED ORDER — KETOROLAC TROMETHAMINE 60 MG/2ML IM SOLN
30.0000 mg | Freq: Once | INTRAMUSCULAR | Status: AC
  Administered 2023-07-23: 30 mg via INTRAMUSCULAR

## 2023-07-23 MED ORDER — NAPROXEN 500 MG PO TABS: 500.0000 mg | ORAL_TABLET | Freq: Two times a day (BID) | ORAL | 0 refills | Status: AC | PRN

## 2023-07-23 NOTE — Discharge Instructions (Signed)
-  Negative COVID -Hopefully the injection of ketorolac  helps you! I sent an anti-inflammatory to pharmacy. Can continue Tylenol too or consider Excedrin, rest and fluids. -If fever, neck stiffness, neck pain, confusion, weakness, dizziness, vomiting, or acute worsening of symptoms please go to ER for re-evaluation.

## 2023-07-23 NOTE — ED Provider Notes (Signed)
 [facility]    CSN: [id number] Arrival date & time: 07/23/23  1027      History   Chief Complaint Chief Complaint  Patient presents with   Headache   Fatigue    HPI Timothy Garza is a 23 y.o. male presenting for headaches and fatigue x 3 days.  Reports feeling short of breath on Saturday but that has gone away.  Reports that his legs felt numb when he was sleeping last night but that has gone away.  No fever, cough, congestion, sore throat, dizziness, nausea/vomiting, chest pain, palpitations, leg swelling.  No neck stiffness or pain.  Denies any sick contacts.  Has taken Tylenol for the headache today but says it has not helped.  Took ibuprofen  yesterday without relief.  No history of headaches or migraines.  HPI  Past Medical History:  Diagnosis Date   Asthma     There are no active problems to display for this patient.   History reviewed. No pertinent surgical history.     Home Medications    Prior to Admission medications   Medication Sig Start Date End Date Taking? Authorizing Provider  naproxen (NAPROSYN) 500 MG tablet Take 1 tablet (500 mg total) by mouth 2 (two) times daily as needed for headache. 07/23/23  Yes Arvis Jolan NOVAK, PA-C  PROAIR HFA 108 (90 Base) MCG/ACT inhaler Inhale 2 puffs into the lungs every 4 (four) hours as needed. 06/03/20   [provider]    Family History Family History  Problem Relation Age of Onset   Healthy Mother    Healthy Father     Social History Social History   Tobacco Use   Smoking status: Never   Smokeless tobacco: Never  Vaping Use   Vaping status: Never Used  Substance Use Topics   Alcohol use: Never   Drug use: Never     Allergies   Patient has no known allergies.   Review of Systems Review of Systems  Constitutional:  Positive for fatigue. Negative for fever.  HENT:  Negative for congestion, rhinorrhea, sinus pressure, sinus pain and sore throat.   Respiratory:  Negative for  cough and shortness of breath.   Cardiovascular:  Negative for chest pain.  Gastrointestinal:  Negative for abdominal pain, diarrhea, nausea and vomiting.  Musculoskeletal:  Negative for myalgias.  Neurological:  Positive for headaches. Negative for dizziness, weakness and light-headedness.  Hematological:  Negative for adenopathy.     Physical Exam Triage Vital Signs ED Triage Vitals  Encounter Vitals Group     BP 07/23/23 1035 117/76     Girls Systolic BP Percentile --      Girls Diastolic BP Percentile --      Boys Systolic BP Percentile --      Boys Diastolic BP Percentile --      Pulse Rate 07/23/23 1035 60     Resp 07/23/23 1035 16     Temp 07/23/23 1035 98.2 F (36.8 C)     Temp Source 07/23/23 1035 Oral     SpO2 07/23/23 1035 98 %     Weight 07/23/23 1035 215 lb (97.5 kg)     Height 07/23/23 1035 5' 9 (1.753 m)     Head Circumference --      Peak Flow --      Pain Score 07/23/23 1041 8     Pain Loc --      Pain Education --      Exclude from Growth Chart --  No data found.  Updated Vital Signs BP 117/76 (BP Location: Right Arm)   Pulse 60   Temp 98.2 F (36.8 C) (Oral)   Resp 16   Ht 5' 9 (1.753 m)   Wt 215 lb (97.5 kg)   SpO2 98%   BMI 31.75 kg/m      Physical Exam Vitals and nursing note reviewed.  Constitutional:      General: He is not in acute distress.    Appearance: He is well-developed. He is not ill-appearing.  HENT:     Head: Normocephalic and atraumatic.     Nose: Nose normal.     Mouth/Throat:     Mouth: Mucous membranes are moist.     Pharynx: Oropharynx is clear.   Eyes:     General: No scleral icterus.    Extraocular Movements: Extraocular movements intact.     Conjunctiva/sclera: Conjunctivae normal.     Pupils: Pupils are equal, round, and reactive to light.    Cardiovascular:     Rate and Rhythm: Normal rate and regular rhythm.     Heart sounds: Normal heart sounds.  Pulmonary:     Effort: Pulmonary effort is  normal. No respiratory distress.     Breath sounds: Normal breath sounds.  Abdominal:     Palpations: Abdomen is soft.     Tenderness: There is no abdominal tenderness.   Musculoskeletal:     Cervical back: Neck supple.   Skin:    General: Skin is warm and dry.     Capillary Refill: Capillary refill takes less than 2 seconds.   Neurological:     General: No focal deficit present.     Mental Status: He is alert. Mental status is at baseline.     Motor: No weakness.     Gait: Gait normal.   Psychiatric:        Mood and Affect: Mood normal.        Behavior: Behavior normal.      UC Treatments / Results  Labs (all labs ordered are listed, but only abnormal results are displayed) Labs Reviewed  SARS CORONAVIRUS 2 BY RT PCR    EKG   Radiology No results found.  Procedures Procedures (including critical care time)  Medications Ordered in UC Medications  ketorolac  (TORADOL ) injection 30 mg (30 mg Intramuscular Given 07/23/23 1123)    Initial Impression / Assessment and Plan / UC Course  I have reviewed the triage vital signs and the nursing notes.  Pertinent labs & imaging results that were available during my care of the patient were reviewed by me and considered in my medical decision making (see chart for details).   23 year old male presents for headaches and fatigue x 3 days.  No fever or URI symptoms.  No chest pain or shortness of breath currently.  No palpitations, weakness, dizziness, numbness/tingling.  Vitals are all stable and normal and he is overall well-appearing.  No acute distress.  Normal HEENT exam.  Normal cranial nerves.  Chest clear.  Heart regular rate and rhythm.  PCR COVID test obtained.  Patient given 30 mg IM ketorolac  for headache.  Negative COVID.   General headache, likely tension type. Sent Naproxen. Continue Tylenol. Reviewed return and ED precautions for headaches: Severe worsening of headache, vision changes, dizziness, confusion,  weakness, vomiting, neck pain/stiffness, fever, etc. Work note given.   Final Clinical Impressions(s) / UC Diagnoses   Final diagnoses:  Acute nonintractable headache, unspecified headache type  Other fatigue  Discharge Instructions      -Negative COVID -Hopefully the injection of ketorolac  helps you! I sent an anti-inflammatory to pharmacy. Can continue Tylenol too or consider Excedrin, rest and fluids. -If fever, neck stiffness, neck pain, confusion, weakness, dizziness, vomiting, or acute worsening of symptoms please go to ER for re-evaluation.      ED Prescriptions     Medication Sig Dispense Auth. Provider   naproxen (NAPROSYN) 500 MG tablet Take 1 tablet (500 mg total) by mouth 2 (two) times daily as needed for headache. 15 tablet Aigner Horseman B, PA-C      PDMP not reviewed this encounter.   Arvis Jolan NOVAK, PA-C 07/23/23 1137

## 2023-07-23 NOTE — ED Triage Notes (Signed)
 Pt c/o HA & fatigue x4 days. Has tried OTC meds w/o relief.

## 2023-08-04 DIAGNOSIS — Z419 Encounter for procedure for purposes other than remedying health state, unspecified: Secondary | ICD-10-CM | POA: Diagnosis not present

## 2023-09-04 DIAGNOSIS — Z419 Encounter for procedure for purposes other than remedying health state, unspecified: Secondary | ICD-10-CM | POA: Diagnosis not present

## 2023-10-05 DIAGNOSIS — Z419 Encounter for procedure for purposes other than remedying health state, unspecified: Secondary | ICD-10-CM | POA: Diagnosis not present
